# Patient Record
Sex: Male | Born: 1990 | Race: White | Hispanic: No | Marital: Single | Smoking: Never smoker
Health system: Southern US, Community
[De-identification: ages and names within clinical notes are randomized; demographics above are authoritative.]

## PROBLEM LIST (undated history)

## (undated) HISTORY — PX: MOUTH SURGERY: SHX715

---

## 2012-06-03 ENCOUNTER — Ambulatory Visit: Payer: Self-pay | Admitting: Emergency Medicine

## 2012-06-03 LAB — RAPID STREP-A WITH REFLX: Micro Text Report: POSITIVE

## 2018-07-10 ENCOUNTER — Encounter: Payer: Self-pay | Admitting: Emergency Medicine

## 2018-07-10 ENCOUNTER — Ambulatory Visit (INDEPENDENT_AMBULATORY_CARE_PROVIDER_SITE_OTHER): Payer: BLUE CROSS/BLUE SHIELD

## 2018-07-10 ENCOUNTER — Ambulatory Visit
Admission: EM | Admit: 2018-07-10 | Discharge: 2018-07-10 | Disposition: A | Discharge status: Home / Self Care | Payer: BLUE CROSS/BLUE SHIELD | Attending: Internal Medicine | Admitting: Internal Medicine

## 2018-07-10 ENCOUNTER — Other Ambulatory Visit: Payer: Self-pay

## 2018-07-10 DIAGNOSIS — R0602 Shortness of breath: Secondary | ICD-10-CM | POA: Diagnosis not present

## 2018-07-10 DIAGNOSIS — J209 Acute bronchitis, unspecified: Secondary | ICD-10-CM

## 2018-07-10 DIAGNOSIS — R05 Cough: Secondary | ICD-10-CM

## 2018-07-10 DIAGNOSIS — R509 Fever, unspecified: Secondary | ICD-10-CM | POA: Diagnosis not present

## 2018-07-10 DIAGNOSIS — R0982 Postnasal drip: Secondary | ICD-10-CM | POA: Diagnosis not present

## 2018-07-10 MED ORDER — BENZONATATE 100 MG PO CAPS: 100.0000 mg | ORAL_CAPSULE | Freq: Three times a day (TID) | ORAL | 0 refills | Status: AC

## 2018-07-10 MED ORDER — PREDNISONE 10 MG PO TABS: 20.0000 mg | ORAL_TABLET | Freq: Every day | ORAL | 0 refills | Status: AC

## 2018-07-10 MED ORDER — ALBUTEROL SULFATE 108 (90 BASE) MCG/ACT IN AEPB: 2.0000 | INHALATION_SPRAY | Freq: Four times a day (QID) | RESPIRATORY_TRACT | 0 refills | Status: AC | PRN

## 2018-07-10 MED ORDER — FLUTICASONE PROPIONATE 50 MCG/ACT NA SUSP: 1.0000 | Freq: Every day | NASAL | 0 refills | Status: AC

## 2018-07-10 NOTE — ED Triage Notes (Signed)
Patient states he has been sick since 2 months ago and has been treated for ear infection and sinus infection.  Patient states he is still coughing a lot, everything else is better but he is still coughing

## 2018-07-10 NOTE — ED Provider Notes (Addendum)
[facility]    CSN: [id number] Arrival date & time: 07/10/18  1641     History   Chief Complaint Cough of several weeks duration  HPI Matthew Wolfe is a 28 y.o. male no past medical history comes to the urgent care with complaints of cough of several weeks duration.  Patient was initially treated with a seven-day course of amoxicillin at the beginning of January about 7 weeks ago.  Patient completed treatment course and continued to have a cough.  Cough was initially productive but soon became clear.  Cough was worse in the morning when he woke up and improved but never went away during the rest of the day.  He admits to having some wheezing after prolonged cough.  No known relieving factors.  Patient completed a second course of antibiotics (Augmentin-10 days duration).  Cough was transiently associated with pleuritic chest pain which is currently resolved.  No nausea or vomiting.Marland Kitchen   HPI  History reviewed. No pertinent past medical history.  There are no active problems to display for this patient.   Past Surgical History:  Procedure Laterality Date  . MOUTH SURGERY         Home Medications    Prior to Admission medications   Medication Sig Start Date End Date Taking? Authorizing Provider  Albuterol Sulfate 108 (90 Base) MCG/ACT AEPB Inhale 2 puffs into the lungs every 6 (six) hours as needed (wheezing). 07/10/18   Martie Muhlbauer, Britta Mccreedy, MD  benzonatate (TESSALON) 100 MG capsule Take 1 capsule (100 mg total) by mouth every 8 (eight) hours. 07/10/18   Merrilee Jansky, MD  fluticasone (FLONASE) 50 MCG/ACT nasal spray Place 1 spray into both nostrils daily. 07/10/18   Amalya Salmons, Britta Mccreedy, MD  predniSONE (DELTASONE) 10 MG tablet Take 2 tablets (20 mg total) by mouth daily for 5 days. 07/10/18 07/15/18  Merrilee Jansky, MD    Family History Family History  Problem Relation Age of Onset  . Heart failure Father     Social History Social History   Tobacco Use  . Smoking  status: Never Smoker  . Smokeless tobacco: Never Used  Substance Use Topics  . Alcohol use: Yes  . Drug use: Never     Allergies   Patient has no known allergies.   Review of Systems Review of Systems  Constitutional: Negative for activity change, appetite change and fever.  HENT: Positive for congestion, postnasal drip, rhinorrhea, sore throat and voice change. Negative for hearing loss, sinus pressure and sinus pain.   Eyes: Negative for pain, discharge and itching.  Respiratory: Negative for chest tightness and shortness of breath.   Cardiovascular: Negative for chest pain and palpitations.  Gastrointestinal: Negative for abdominal distention, abdominal pain, nausea and vomiting.  Musculoskeletal: Negative for arthralgias, joint swelling and myalgias.  Skin: Negative for rash and wound.  Neurological: Negative for dizziness, weakness and headaches.  Psychiatric/Behavioral: Negative for agitation and confusion.     Physical Exam Triage Vital Signs ED Triage Vitals  Enc Vitals Group     BP 07/10/18 1747 (!) 147/93     Pulse Rate 07/10/18 1747 86     Resp 07/10/18 1747 18     Temp 07/10/18 1747 98.4 F (36.9 C)     Temp Source 07/10/18 1747 Oral     SpO2 07/10/18 1747 100 %     Weight 07/10/18 1742 172 lb (78 kg)     Height 07/10/18 1742 5\' 8"  (1.727 m)  Head Circumference --      Peak Flow --      Pain Score 07/10/18 1742 0     Pain Loc --      Pain Edu? --      Excl. in GC? --    No data found.  Updated Vital Signs BP (!) 147/93 (BP Location: Right Arm)   Pulse 86   Temp 98.4 F (36.9 C) (Oral)   Resp 18   Ht  (1.727 m)   Wt 78 kg   SpO2 100%   BMI 26.15 kg/m   Visual Acuity Right Eye Distance:   Left Eye Distance:   Bilateral Distance:    Right Eye Near:   Left Eye Near:    Bilateral Near:     Physical Exam Constitutional:      Appearance: Normal appearance.  HENT:     Right Ear: Tympanic membrane normal.     Left Ear: Tympanic  membrane normal.     Nose: Nose normal.     Mouth/Throat:     Mouth: Mucous membranes are moist.     Pharynx: No oropharyngeal exudate or posterior oropharyngeal erythema.  Eyes:     Conjunctiva/sclera: Conjunctivae normal.  Neck:     Musculoskeletal: Normal range of motion. No neck rigidity.  Cardiovascular:     Rate and Rhythm: Normal rate and regular rhythm.     Pulses: Normal pulses.     Heart sounds: Normal heart sounds.  Pulmonary:     Effort: Pulmonary effort is normal.     Breath sounds: Normal breath sounds.  Abdominal:     General: Bowel sounds are normal.     Palpations: Abdomen is soft.  Musculoskeletal: Normal range of motion.  Lymphadenopathy:     Cervical: No cervical adenopathy.  Skin:    General: Skin is warm.     Capillary Refill: Capillary refill takes less than 2 seconds.  Neurological:     General: No focal deficit present.     Mental Status: He is alert and oriented to person, place, and time.      UC Treatments / Results  Labs (all labs ordered are listed, but only abnormal results are displayed) Labs Reviewed - No data to display  EKG None  Radiology No results found.  Procedures Procedures (including critical care time)  Medications Ordered in UC Medications - No data to display  Initial Impression / Assessment and Plan / UC Course  I have reviewed the triage vital signs and the nursing notes.  Pertinent labs & imaging results that were available during my care of the patient were reviewed by me and considered in my medical decision making (see chart for details).     1.  Acute bronchitis with bronchospasm: Albuterol inhaler Tessalon Perles as needed for cough Prednisone 20 mg orally daily for 5 days  2.  Postnasal drip with chronic cough: Flonase Saline nasal spray Humidifier use   Final Clinical Impressions(s) / UC Diagnoses   Final diagnoses:  None   Discharge Instructions   None    ED Prescriptions    Medication  Sig Dispense Auth. Provider   fluticasone (FLONASE) 50 MCG/ACT nasal spray Place 1 spray into both nostrils daily. 16 g Merrilee Jansky, MD   Albuterol Sulfate 108 (90 Base) MCG/ACT AEPB Inhale 2 puffs into the lungs every 6 (six) hours as needed (wheezing). 1 each Merryl Buckels, Britta Mccreedy, MD   benzonatate (TESSALON) 100 MG capsule Take 1 capsule (100 mg  total) by mouth every 8 (eight) hours. 21 capsule Saharsh Sterling, Britta Mccreedy, MD   predniSONE (DELTASONE) 10 MG tablet Take 2 tablets (20 mg total) by mouth daily for 5 days. 10 tablet Moataz Tavis, Britta Mccreedy, MD     Controlled Substance Prescriptions [place] Controlled Substance Registry consulted? No   Merrilee Jansky, MD 07/10/18 Windy Carina    Merrilee Jansky, MD 07/11/18 [phone]

## 2019-04-23 ENCOUNTER — Other Ambulatory Visit: Payer: Self-pay

## 2019-04-23 DIAGNOSIS — Z20822 Contact with and (suspected) exposure to covid-19: Secondary | ICD-10-CM

## 2019-04-24 LAB — NOVEL CORONAVIRUS, NAA: SARS-CoV-2, NAA: DETECTED — AB

## 2019-04-25 NOTE — Progress Notes (Signed)
I attempted to call regarding his COVID-19 test result however got his answering machine.   I left a voicemail to call back to [phone] regarding his recent test result.  He viewed his result on MyChart  04/24/2019 at 10:36 PM. I also sent him a detailed MyChart message with the 10 day quarantine instructions, signs and symptoms to monitor and call his doctor or go to the ED.  I went over the cleaning of frequently touched surfaces, hand washing, wearing a mask and social distancing.    [facility] Dept notified.

## 2020-08-08 IMAGING — CR DG CHEST 2V
2 series · 2 of 2 positions shown · non-contrast
Comparison: None.

CLINICAL DATA: Productive cough.  Fever.  Shortness of breath.

EXAM:
CHEST - 2 VIEW

[chest pa]
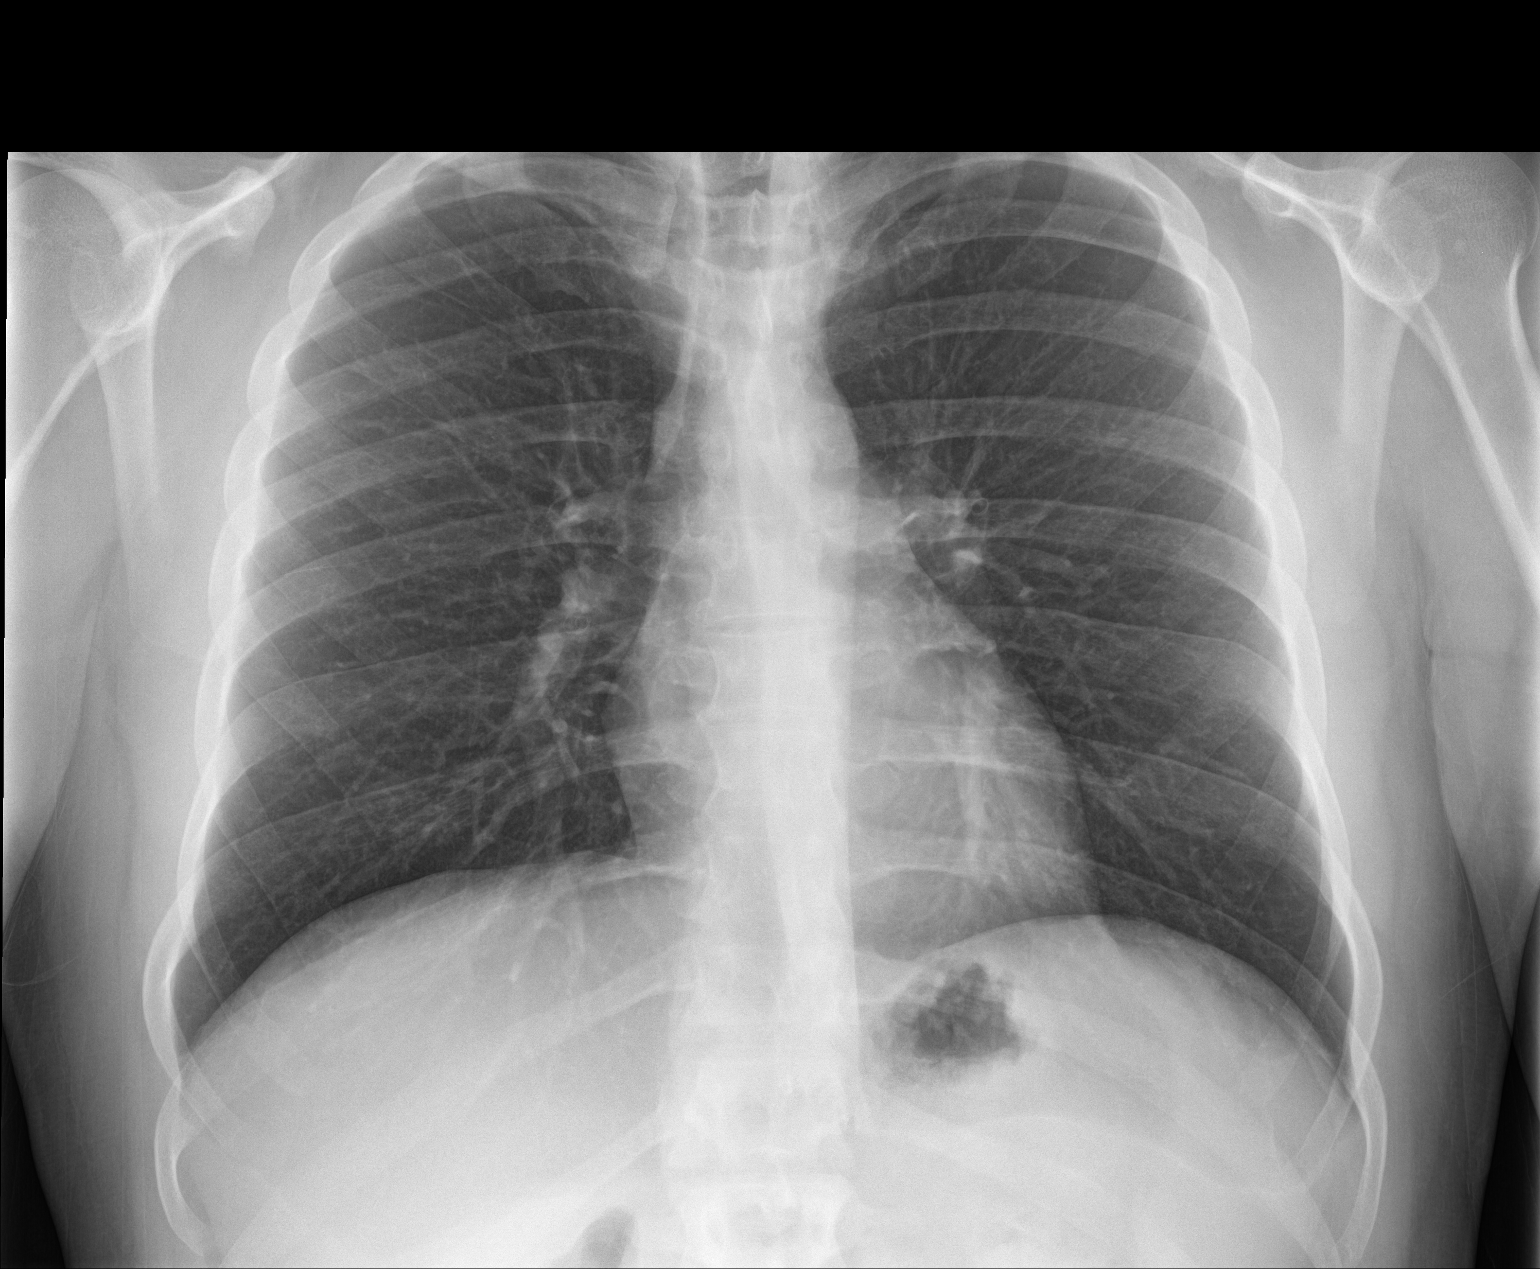

[chest lat]
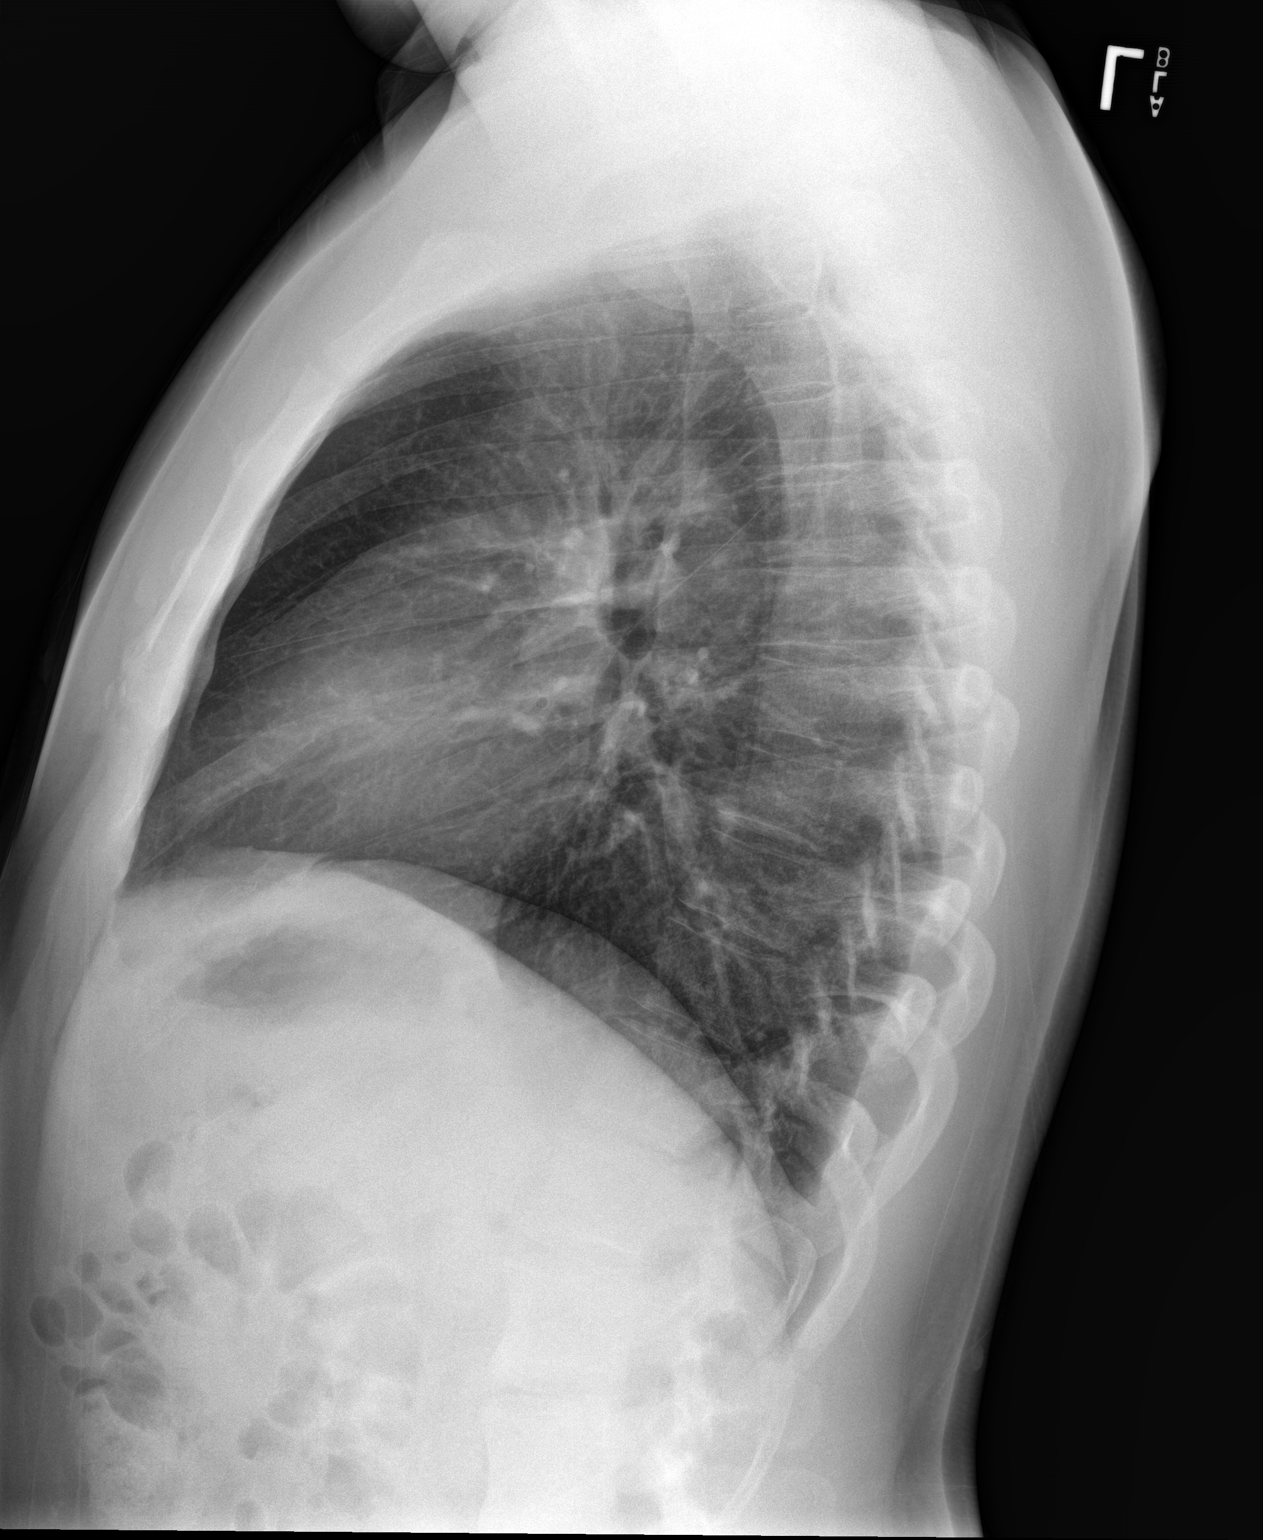

[2 of 2 positions shown; findings below may reference images not displayed]

FINDINGS: The heart size and mediastinal contours are within normal limits.
Both lungs are clear. The visualized skeletal structures are
unremarkable.
IMPRESSION: Normal two-view chest x-ray.

## 2021-08-27 DIAGNOSIS — D2272 Melanocytic nevi of left lower limb, including hip: Secondary | ICD-10-CM | POA: Diagnosis not present

## 2021-08-27 DIAGNOSIS — D2271 Melanocytic nevi of right lower limb, including hip: Secondary | ICD-10-CM | POA: Diagnosis not present

## 2021-08-27 DIAGNOSIS — D485 Neoplasm of uncertain behavior of skin: Secondary | ICD-10-CM | POA: Diagnosis not present

## 2021-08-27 DIAGNOSIS — L821 Other seborrheic keratosis: Secondary | ICD-10-CM | POA: Diagnosis not present

## 2021-08-27 DIAGNOSIS — D225 Melanocytic nevi of trunk: Secondary | ICD-10-CM | POA: Diagnosis not present

## 2021-08-27 DIAGNOSIS — D2261 Melanocytic nevi of right upper limb, including shoulder: Secondary | ICD-10-CM | POA: Diagnosis not present

## 2021-08-27 DIAGNOSIS — D2262 Melanocytic nevi of left upper limb, including shoulder: Secondary | ICD-10-CM | POA: Diagnosis not present
